# Patient Record
Sex: Male | Born: 1955 | Race: White | Hispanic: No | Marital: Married | Smoking: Current every day smoker
Health system: Southern US, Community
[De-identification: ages and names within clinical notes are randomized; demographics above are authoritative.]

## PROBLEM LIST (undated history)

## (undated) DIAGNOSIS — R351 Nocturia: Secondary | ICD-10-CM

## (undated) DIAGNOSIS — J410 Simple chronic bronchitis: Secondary | ICD-10-CM

## (undated) DIAGNOSIS — M549 Dorsalgia, unspecified: Secondary | ICD-10-CM

## (undated) DIAGNOSIS — R3915 Urgency of urination: Secondary | ICD-10-CM

## (undated) DIAGNOSIS — K219 Gastro-esophageal reflux disease without esophagitis: Secondary | ICD-10-CM

## (undated) DIAGNOSIS — R531 Weakness: Secondary | ICD-10-CM

## (undated) DIAGNOSIS — R519 Headache, unspecified: Secondary | ICD-10-CM

## (undated) DIAGNOSIS — R350 Frequency of micturition: Secondary | ICD-10-CM

## (undated) DIAGNOSIS — I10 Essential (primary) hypertension: Secondary | ICD-10-CM

## (undated) DIAGNOSIS — M1990 Unspecified osteoarthritis, unspecified site: Secondary | ICD-10-CM

## (undated) DIAGNOSIS — R51 Headache: Secondary | ICD-10-CM

## (undated) DIAGNOSIS — M2550 Pain in unspecified joint: Secondary | ICD-10-CM

## (undated) HISTORY — PX: LASIK: SHX215

---

## 1999-01-08 ENCOUNTER — Encounter: Payer: Self-pay | Admitting: Family Medicine

## 1999-01-08 ENCOUNTER — Ambulatory Visit (HOSPITAL_COMMUNITY): Admission: RE | Admit: 1999-01-08 | Discharge: 1999-01-08 | Payer: Self-pay | Admitting: Family Medicine

## 1999-01-20 ENCOUNTER — Encounter: Admission: RE | Admit: 1999-01-20 | Discharge: 1999-02-23 | Payer: Self-pay | Admitting: Family Medicine

## 2001-07-21 ENCOUNTER — Emergency Department (HOSPITAL_COMMUNITY): Admission: EM | Admit: 2001-07-21 | Discharge: 2001-07-21 | Payer: Self-pay | Admitting: Emergency Medicine

## 2003-07-24 ENCOUNTER — Ambulatory Visit (HOSPITAL_COMMUNITY): Admission: RE | Admit: 2003-07-24 | Discharge: 2003-07-24 | Payer: Self-pay | Admitting: Family Medicine

## 2010-02-14 HISTORY — PX: CERVICAL FUSION: SHX112

## 2010-10-26 ENCOUNTER — Other Ambulatory Visit: Payer: Self-pay

## 2010-10-26 ENCOUNTER — Other Ambulatory Visit: Payer: Self-pay | Admitting: Family Medicine

## 2010-10-26 ENCOUNTER — Other Ambulatory Visit: Payer: Self-pay | Admitting: Neurological Surgery

## 2010-10-26 DIAGNOSIS — IMO0002 Reserved for concepts with insufficient information to code with codable children: Secondary | ICD-10-CM

## 2010-10-26 DIAGNOSIS — M5412 Radiculopathy, cervical region: Secondary | ICD-10-CM

## 2010-11-09 ENCOUNTER — Ambulatory Visit
Admission: RE | Admit: 2010-11-09 | Discharge: 2010-11-09 | Disposition: A | Discharge status: Home / Self Care | Payer: 59 | Source: Ambulatory Visit | Attending: Family Medicine | Admitting: Family Medicine

## 2010-11-09 DIAGNOSIS — M5412 Radiculopathy, cervical region: Secondary | ICD-10-CM

## 2010-11-09 DIAGNOSIS — IMO0002 Reserved for concepts with insufficient information to code with codable children: Secondary | ICD-10-CM

## 2011-02-23 ENCOUNTER — Other Ambulatory Visit: Payer: Self-pay | Admitting: Neurosurgery

## 2011-02-23 ENCOUNTER — Ambulatory Visit
Admission: RE | Admit: 2011-02-23 | Discharge: 2011-02-23 | Disposition: A | Discharge status: Home / Self Care | Payer: 59 | Source: Ambulatory Visit | Attending: Neurosurgery | Admitting: Neurosurgery

## 2011-02-23 DIAGNOSIS — M542 Cervicalgia: Secondary | ICD-10-CM

## 2011-04-06 ENCOUNTER — Other Ambulatory Visit: Payer: Self-pay | Admitting: Neurosurgery

## 2011-04-06 DIAGNOSIS — M542 Cervicalgia: Secondary | ICD-10-CM

## 2011-04-13 ENCOUNTER — Ambulatory Visit
Admission: RE | Admit: 2011-04-13 | Discharge: 2011-04-13 | Disposition: A | Discharge status: Home / Self Care | Payer: 59 | Source: Ambulatory Visit | Attending: Neurosurgery | Admitting: Neurosurgery

## 2011-04-13 DIAGNOSIS — M542 Cervicalgia: Secondary | ICD-10-CM

## 2011-09-05 ENCOUNTER — Other Ambulatory Visit: Payer: Self-pay | Admitting: Neurosurgery

## 2011-09-05 DIAGNOSIS — M542 Cervicalgia: Secondary | ICD-10-CM

## 2011-09-16 ENCOUNTER — Other Ambulatory Visit: Payer: 59

## 2013-09-10 ENCOUNTER — Other Ambulatory Visit: Payer: Self-pay | Admitting: Neurosurgery

## 2013-09-10 DIAGNOSIS — M47812 Spondylosis without myelopathy or radiculopathy, cervical region: Secondary | ICD-10-CM

## 2013-09-27 ENCOUNTER — Ambulatory Visit
Admission: RE | Admit: 2013-09-27 | Discharge: 2013-09-27 | Disposition: A | Discharge status: Home / Self Care | Payer: No Typology Code available for payment source | Source: Ambulatory Visit | Attending: Neurosurgery | Admitting: Neurosurgery

## 2013-09-27 ENCOUNTER — Other Ambulatory Visit: Payer: Self-pay | Admitting: Neurosurgery

## 2013-09-27 DIAGNOSIS — M47812 Spondylosis without myelopathy or radiculopathy, cervical region: Secondary | ICD-10-CM

## 2013-09-27 MED ORDER — IOHEXOL 300 MG/ML  SOLN
1.0000 mL | Freq: Once | INTRAMUSCULAR | Status: AC | PRN
  Administered 2013-09-27: 1 mL via INTRA_ARTICULAR

## 2013-09-27 MED ORDER — DEXAMETHASONE SODIUM PHOSPHATE 4 MG/ML IJ SOLN
12.0000 mg | Freq: Once | INTRAMUSCULAR | Status: AC
  Administered 2013-09-27: 12 mg via INTRA_ARTICULAR

## 2013-09-27 NOTE — Discharge Instructions (Signed)

## 2014-04-15 ENCOUNTER — Other Ambulatory Visit: Payer: Self-pay | Admitting: Neurosurgery

## 2014-04-15 DIAGNOSIS — M47812 Spondylosis without myelopathy or radiculopathy, cervical region: Secondary | ICD-10-CM

## 2014-04-22 ENCOUNTER — Ambulatory Visit
Admission: RE | Admit: 2014-04-22 | Discharge: 2014-04-22 | Disposition: A | Discharge status: Home / Self Care | Payer: 59 | Source: Ambulatory Visit | Attending: Neurosurgery | Admitting: Neurosurgery

## 2014-04-22 VITALS — BP 178/82 | HR 95

## 2014-04-22 DIAGNOSIS — M4712 Other spondylosis with myelopathy, cervical region: Secondary | ICD-10-CM

## 2014-04-22 DIAGNOSIS — M47812 Spondylosis without myelopathy or radiculopathy, cervical region: Secondary | ICD-10-CM

## 2014-04-22 MED ORDER — ONDANSETRON HCL 4 MG/2ML IJ SOLN: 4.0000 mg | Freq: Four times a day (QID) | INTRAMUSCULAR | Status: DC | PRN

## 2014-04-22 MED ORDER — DIAZEPAM 5 MG PO TABS
10.0000 mg | ORAL_TABLET | Freq: Once | ORAL | Status: AC
  Administered 2014-04-22: 10 mg via ORAL

## 2014-04-22 MED ORDER — IOHEXOL 300 MG/ML  SOLN
10.0000 mL | Freq: Once | INTRAMUSCULAR | Status: AC | PRN
  Administered 2014-04-22: 10 mL via INTRATHECAL

## 2014-04-22 MED ORDER — HYDROMORPHONE HCL 1 MG/ML IJ SOLN
1.0000 mg | Freq: Once | INTRAMUSCULAR | Status: AC
  Administered 2014-04-22: 1 mg via INTRAMUSCULAR

## 2014-04-22 MED ORDER — ONDANSETRON HCL 4 MG/2ML IJ SOLN
4.0000 mg | Freq: Once | INTRAMUSCULAR | Status: AC
Start: 2014-04-22 — End: 2014-04-22
  Administered 2014-04-22: 4 mg via INTRAMUSCULAR

## 2014-04-22 NOTE — Discharge Instructions (Addendum)
Myelogram Discharge Instructions  1. Go home and rest quietly for the next 24 hours.  It is important to lie flat for the next 24 hours.  Get up only to go to the restroom.  You may lie in the bed or on a couch on your back, your stomach, your left side or your right side.  You may have one pillow under your head.  You may have pillows between your knees while you are on your side or under your knees while you are on your back.  2. DO NOT drive today.  Recline the seat as far back as it will go, while still wearing your seat belt, on the way home.  3. You may get up to go to the bathroom as needed.  You may sit up for 10 minutes to eat.  You may resume your normal diet and medications unless otherwise indicated.  Drink lots of extra fluids today and tomorrow.  4. The incidence of headache, nausea, or vomiting is about 5% (one in 20 patients).  If you develop a headache, lie flat and drink plenty of fluids until the headache goes away.  Caffeinated beverages may be helpful.  If you develop severe nausea and vomiting or a headache that does not go away with flat bed rest, call [phone].  5. You may resume normal activities after your 24 hours of bed rest is over; however, do not exert yourself strongly or do any heavy lifting tomorrow. If when you get up you have a headache when standing, go back to bed and force fluids for another 24 hours.  6. Call your physician for a follow-up appointment.  The results of your myelogram will be sent directly to your physician by the following day.  7. If you have any questions or if complications develop after you arrive home, please call [phone].  Discharge instructions have been explained to the patient.  The patient, or the person responsible for the patient, fully understands these instructions.      May resume Ultram on April 23, 2014, after 2 pm.

## 2014-04-22 NOTE — Progress Notes (Addendum)
Patient states he has been off Ultram/Tramadol for at least the past two days prior to cervical myelogram.  Donell SievertJeanne Haruo Stepanek, RN

## 2014-08-29 ENCOUNTER — Other Ambulatory Visit: Payer: Self-pay | Admitting: Neurosurgery

## 2014-09-03 ENCOUNTER — Encounter (HOSPITAL_COMMUNITY)
Admission: RE | Admit: 2014-09-03 | Discharge: 2014-09-03 | Disposition: A | Discharge status: Home / Self Care | Payer: 59 | Source: Ambulatory Visit | Attending: Neurosurgery | Admitting: Neurosurgery

## 2014-09-03 ENCOUNTER — Encounter (HOSPITAL_COMMUNITY): Payer: Self-pay

## 2014-09-03 DIAGNOSIS — Z87891 Personal history of nicotine dependence: Secondary | ICD-10-CM | POA: Diagnosis not present

## 2014-09-03 DIAGNOSIS — I10 Essential (primary) hypertension: Secondary | ICD-10-CM | POA: Insufficient documentation

## 2014-09-03 DIAGNOSIS — M479 Spondylosis, unspecified: Secondary | ICD-10-CM | POA: Insufficient documentation

## 2014-09-03 DIAGNOSIS — M549 Dorsalgia, unspecified: Secondary | ICD-10-CM | POA: Diagnosis not present

## 2014-09-03 DIAGNOSIS — Z01812 Encounter for preprocedural laboratory examination: Secondary | ICD-10-CM | POA: Insufficient documentation

## 2014-09-03 DIAGNOSIS — I4510 Unspecified right bundle-branch block: Secondary | ICD-10-CM | POA: Diagnosis not present

## 2014-09-03 DIAGNOSIS — Z981 Arthrodesis status: Secondary | ICD-10-CM | POA: Diagnosis not present

## 2014-09-03 DIAGNOSIS — Z01818 Encounter for other preprocedural examination: Secondary | ICD-10-CM | POA: Insufficient documentation

## 2014-09-03 DIAGNOSIS — K219 Gastro-esophageal reflux disease without esophagitis: Secondary | ICD-10-CM | POA: Diagnosis not present

## 2014-09-03 HISTORY — DX: Pain in unspecified joint: M25.50

## 2014-09-03 HISTORY — DX: Headache, unspecified: R51.9

## 2014-09-03 HISTORY — DX: Essential (primary) hypertension: I10

## 2014-09-03 HISTORY — DX: Urgency of urination: R39.15

## 2014-09-03 HISTORY — DX: Unspecified osteoarthritis, unspecified site: M19.90

## 2014-09-03 HISTORY — DX: Nocturia: R35.1

## 2014-09-03 HISTORY — DX: Dorsalgia, unspecified: M54.9

## 2014-09-03 HISTORY — DX: Weakness: R53.1

## 2014-09-03 HISTORY — DX: Frequency of micturition: R35.0

## 2014-09-03 HISTORY — DX: Simple chronic bronchitis: J41.0

## 2014-09-03 HISTORY — DX: Headache: R51

## 2014-09-03 HISTORY — DX: Gastro-esophageal reflux disease without esophagitis: K21.9

## 2014-09-03 LAB — SURGICAL PCR SCREEN
MRSA, PCR: NEGATIVE
Staphylococcus aureus: POSITIVE — AB

## 2014-09-03 NOTE — Progress Notes (Addendum)
Cardiologist denies having one  [facility]  Echo denies ever having one  Stress test denies ever having one  Heart cath denies ever having one  EKG denies having one in past yr  CXR denies in past yr  CBC/CMET done on the July 15 requested from Dr.Everyly

## 2014-09-03 NOTE — Progress Notes (Signed)
   09/03/14 1520  OBSTRUCTIVE SLEEP APNEA  Have you ever been diagnosed with sleep apnea through a sleep study? No  Do you snore loudly (loud enough to be heard through closed doors)?  1  Do you often feel tired, fatigued, or sleepy during the daytime? 0  Has anyone observed you stop breathing during your sleep? 1  Do you have, or are you being treated for high blood pressure? 1  BMI more than 35 kg/m2? 0  Age over 57 years old? 1  Neck circumference greater than 40 cm/16 inches? 1 (17.5)  Gender: 1

## 2014-09-03 NOTE — Progress Notes (Signed)
   09/03/14 1421  OBSTRUCTIVE SLEEP APNEA  Have you ever been diagnosed with sleep apnea through a sleep study? No  Do you snore loudly (loud enough to be heard through closed doors)?  1  Do you often feel tired, fatigued, or sleepy during the daytime? 0  Has anyone observed you stop breathing during your sleep? 1  Do you have, or are you being treated for high blood pressure? 1  BMI more than 35 kg/m2? 0  Age over 57 years old? 1  Neck circumference greater than 40 cm/16 inches? 1 (17.5)  Gender: 1

## 2014-09-03 NOTE — Progress Notes (Signed)
Mupirocin Ointment Rx called into [facility] for positive PCR of staph. Pt notified and voiced understanding.

## 2014-09-03 NOTE — Pre-Procedure Instructions (Signed)
Benedict Needyavid A Tinch Sr.  09/03/2014      [facility] [place] [address] [place] [id number] Phone: [phone] Fax: [phone]    Your procedure is scheduled on Tues, July 26 @ 12:10 PM  [facility] 10[facility]10 AM.  Call this number if you have problems the morning of surgery:  [phone]   Remember:  Do not eat food or drink liquids after midnight.  Take these medicines the morning of surgery with A SIP OF WATER Pain Pill(if needed)              No Goody's,BC's,Aleve,Aspirin,Ibuprofen,Fish Oil,or any Herbal Medications.    Do not wear jewelry, make-up or nail polish.  Do not wear lotions, powders, or perfumes.  You may wear deodorant.  Do not shave 48 hours prior to surgery.    Do not bring valuables to the hospital.  [facility] is not responsible for any belongings or valuables.  Contacts, dentures or bridgework may not be worn into surgery.  Leave your suitcase in the car.  After surgery it may be brought to your room.  For patients admitted to the hospital, discharge time will be determined by your treatment team.  Patients discharged the day of surgery will not be allowed to drive home.    Special instructions:  [place] - Preparing for Surgery  Before surgery, you can play an important role.  Because skin is not sterile, your skin needs to be as free of germs as possible.  You can reduce the number of germs on you skin by washing with CHG (chlorahexidine gluconate) soap before surgery.  CHG is an antiseptic cleaner which kills germs and bonds with the skin to continue killing germs even after washing.  Please DO NOT use if you have an allergy to CHG or antibacterial soaps.  If your skin becomes reddened/irritated stop using the CHG and inform your nurse when you arrive at Short Stay.  Do not shave (including legs and underarms) for at least 48 hours prior to the  first CHG shower.  You may shave your face.  Please follow these instructions carefully:   1.  Shower with CHG Soap the night before surgery and the                                morning of Surgery.  2.  If you choose to wash your hair, wash your hair first as usual with your       normal shampoo.  3.  After you shampoo, rinse your hair and body thoroughly to remove the                      Shampoo.  4.  Use CHG as you would any other liquid soap.  You can apply chg directly       to the skin and wash gently with scrungie or a clean washcloth.  5.  Apply the CHG Soap to your body ONLY FROM THE NECK DOWN.        Do not use on open wounds or open sores.  Avoid contact with your eyes,       ears, mouth and genitals (private parts).  Wash genitals (private parts)       with your normal soap.  6.  Wash thoroughly, paying  special attention to the area where your surgery        will be performed.  7.  Thoroughly rinse your body with warm water from the neck down.  8.  DO NOT shower/wash with your normal soap after using and rinsing off       the CHG Soap.  9.  Pat yourself dry with a clean towel.            10.  Wear clean pajamas.            11.  Place clean sheets on your bed the night of your first shower and do not        sleep with pets.  Day of Surgery  Do not apply any lotions/deoderants the morning of surgery.  Please wear clean clothes to the hospital/surgery center.    Please read over the following fact sheets that you were given. Pain Booklet, Coughing and Deep Breathing, MRSA Information and Surgical Site Infection Prevention

## 2014-09-04 NOTE — Progress Notes (Addendum)
Anesthesia Chart Review: Patient is a 59 year old male scheduled for C3-4, C4-5 ACDF on 09/09/14 by Dr. Jordan Likes.  History includes smoking, HTN, back pain, GERD, cervical fusion '12. OSA screening score was 6. BMI is consistent with obesity. PCP is Dr. Hope Pigeon.  Meds include Zestoretic, Percocet.  09/03/14 EKG: NSR, possible LAE, LAD, inferior infarct (age undetermined), incomplete RBBB, LVH with repolarization abnormality (laberal T wave abnormality, worse in high lateral leads). He denied having a prior stress, echo, or cardiac cath. There are no comparison EKGs in Epic, Muse, or at Dr. Florence Canner.   04/22/14 CT C-spine: IMPRESSION: ACDF C5-6 and C6-7. 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 Solid fusion C6-7. Disc degeneration and spondylosis right greater than left at C3-4 with right foraminal encroachment mild spinal stenosis. Disc degeneration and spondylosis at C4-5 causing mild spinal stenosis and mild foraminal narrowing bilaterally.  Labs from [facility] [place] from 08/29/14 received. CMET WNL, except glucose was 127. CBC WNL.    Reviewed history and EKG with anesthesiologist Dr. Michelle Piper.  Recommend preoperative cardiology evaluation due to abnormal EKG with history of smoking and HTN. Erie Noe at Dr. Lindalou Hose notified.  Velna Ochs [facility] Short Stay Center/Anesthesiology Phone [phone] 09/04/2014 4:33 PM  Addendum: Patient was referred to cardiologist Dr. Lady Gary with [facility] (see Care Everywhere). His note and recent stress test results are not yet in Care Everywhere, so I called and faxed a request to [facility] asking for those records prior to patient's surgery tomorrow morning. Currently, staff at [facility] say the images for his recent stress test are not up yet to send. In the interim, Dr. Lindalou Hose office did fax a signed clearance note from Dr. Lady Gary that is now on patient's chart. Nursing staff can follow up tomorrow if additional records not yet  received--hopefully, reports can be viewed in Care Everywhere by then.  Velna Ochs [facility] Short Stay Center/Anesthesiology Phone [phone] 09/08/2014 3:46 PM

## 2014-09-09 ENCOUNTER — Encounter (HOSPITAL_COMMUNITY)
Admission: RE | Disposition: A | Discharge status: Home / Self Care | Payer: Self-pay | Source: Ambulatory Visit | Attending: Neurosurgery

## 2014-09-09 ENCOUNTER — Encounter (HOSPITAL_COMMUNITY): Payer: Self-pay | Admitting: General Practice

## 2014-09-09 ENCOUNTER — Ambulatory Visit (HOSPITAL_COMMUNITY): Payer: 59 | Admitting: Certified Registered Nurse Anesthetist

## 2014-09-09 ENCOUNTER — Inpatient Hospital Stay (HOSPITAL_COMMUNITY)
Admission: RE | Admit: 2014-09-09 | Discharge: 2014-09-09 | DRG: 473 | Disposition: A | Discharge status: Home / Self Care | Payer: 59 | Source: Ambulatory Visit | Attending: Neurosurgery | Admitting: Neurosurgery

## 2014-09-09 ENCOUNTER — Ambulatory Visit (HOSPITAL_COMMUNITY): Payer: 59

## 2014-09-09 ENCOUNTER — Ambulatory Visit (HOSPITAL_COMMUNITY): Payer: 59 | Admitting: Vascular Surgery

## 2014-09-09 DIAGNOSIS — F17210 Nicotine dependence, cigarettes, uncomplicated: Secondary | ICD-10-CM | POA: Diagnosis present

## 2014-09-09 DIAGNOSIS — K219 Gastro-esophageal reflux disease without esophagitis: Secondary | ICD-10-CM | POA: Diagnosis present

## 2014-09-09 DIAGNOSIS — M542 Cervicalgia: Secondary | ICD-10-CM | POA: Diagnosis present

## 2014-09-09 DIAGNOSIS — M2578 Osteophyte, vertebrae: Secondary | ICD-10-CM | POA: Diagnosis present

## 2014-09-09 DIAGNOSIS — I10 Essential (primary) hypertension: Secondary | ICD-10-CM | POA: Diagnosis present

## 2014-09-09 DIAGNOSIS — M4722 Other spondylosis with radiculopathy, cervical region: Principal | ICD-10-CM | POA: Diagnosis present

## 2014-09-09 DIAGNOSIS — M4802 Spinal stenosis, cervical region: Secondary | ICD-10-CM | POA: Diagnosis present

## 2014-09-09 DIAGNOSIS — Z419 Encounter for procedure for purposes other than remedying health state, unspecified: Secondary | ICD-10-CM

## 2014-09-09 DIAGNOSIS — M47812 Spondylosis without myelopathy or radiculopathy, cervical region: Secondary | ICD-10-CM | POA: Diagnosis present

## 2014-09-09 HISTORY — PX: ANTERIOR CERVICAL DECOMP/DISCECTOMY FUSION: SHX1161

## 2014-09-09 SURGERY — ANTERIOR CERVICAL DECOMPRESSION/DISCECTOMY FUSION 2 LEVELS
Anesthesia: General

## 2014-09-09 MED ORDER — PROPOFOL 10 MG/ML IV BOLUS
INTRAVENOUS | Status: DC | PRN
  Administered 2014-09-09: 50 mg via INTRAVENOUS
  Administered 2014-09-09: 200 mg via INTRAVENOUS

## 2014-09-09 MED ORDER — PHENYLEPHRINE HCL 10 MG/ML IJ SOLN
INTRAMUSCULAR | Status: DC | PRN
  Administered 2014-09-09: 80 ug via INTRAVENOUS
  Administered 2014-09-09: 120 ug via INTRAVENOUS
  Administered 2014-09-09: 80 ug via INTRAVENOUS
  Administered 2014-09-09: 40 ug via INTRAVENOUS
  Administered 2014-09-09: 80 ug via INTRAVENOUS
  Administered 2014-09-09 (×2): 40 ug via INTRAVENOUS
  Administered 2014-09-09: 80 ug via INTRAVENOUS
  Administered 2014-09-09: 120 ug via INTRAVENOUS
  Administered 2014-09-09 (×2): 40 ug via INTRAVENOUS

## 2014-09-09 MED ORDER — HYDROMORPHONE HCL 1 MG/ML IJ SOLN
INTRAMUSCULAR | Status: AC
  Administered 2014-09-09: 0.5 mg via INTRAVENOUS
  Filled 2014-09-09: qty 1

## 2014-09-09 MED ORDER — CEFAZOLIN SODIUM-DEXTROSE 2-3 GM-% IV SOLR
2.0000 g | INTRAVENOUS | Status: AC
  Administered 2014-09-09: 2 g via INTRAVENOUS
  Filled 2014-09-09: qty 50

## 2014-09-09 MED ORDER — MEPERIDINE HCL 25 MG/ML IJ SOLN
INTRAMUSCULAR | Status: AC
  Administered 2014-09-09: 12.5 mg via INTRAVENOUS
  Filled 2014-09-09: qty 1

## 2014-09-09 MED ORDER — HYDROMORPHONE HCL 1 MG/ML IJ SOLN
0.2500 mg | INTRAMUSCULAR | Status: DC | PRN
  Administered 2014-09-09 (×4): 0.5 mg via INTRAVENOUS

## 2014-09-09 MED ORDER — CYCLOBENZAPRINE HCL 10 MG PO TABS
ORAL_TABLET | ORAL | Status: AC
Start: 2014-09-09 — End: 2014-09-09
  Administered 2014-09-09: 10 mg via ORAL
  Filled 2014-09-09: qty 1

## 2014-09-09 MED ORDER — SUCCINYLCHOLINE CHLORIDE 20 MG/ML IJ SOLN
INTRAMUSCULAR | Status: AC
  Filled 2014-09-09: qty 1

## 2014-09-09 MED ORDER — ONDANSETRON HCL 4 MG/2ML IJ SOLN
INTRAMUSCULAR | Status: DC | PRN
  Administered 2014-09-09: 4 mg via INTRAVENOUS

## 2014-09-09 MED ORDER — LACTATED RINGERS IV SOLN
INTRAVENOUS | Status: DC
  Administered 2014-09-09 (×3): via INTRAVENOUS

## 2014-09-09 MED ORDER — ARTIFICIAL TEARS OP OINT
TOPICAL_OINTMENT | OPHTHALMIC | Status: AC
  Filled 2014-09-09: qty 7

## 2014-09-09 MED ORDER — ONDANSETRON HCL 4 MG/2ML IJ SOLN
INTRAMUSCULAR | Status: AC
  Filled 2014-09-09: qty 2

## 2014-09-09 MED ORDER — ONDANSETRON HCL 4 MG/2ML IJ SOLN
4.0000 mg | Freq: Once | INTRAMUSCULAR | Status: AC | PRN
  Administered 2014-09-09: 4 mg via INTRAVENOUS

## 2014-09-09 MED ORDER — ACETAMINOPHEN 650 MG RE SUPP: 650.0000 mg | RECTAL | Status: DC | PRN

## 2014-09-09 MED ORDER — ONDANSETRON HCL 4 MG/2ML IJ SOLN: 4.0000 mg | INTRAMUSCULAR | Status: DC | PRN

## 2014-09-09 MED ORDER — PHENOL 1.4 % MT LIQD: 1.0000 | OROMUCOSAL | Status: DC | PRN

## 2014-09-09 MED ORDER — NEOSTIGMINE METHYLSULFATE 10 MG/10ML IV SOLN
INTRAVENOUS | Status: AC
  Filled 2014-09-09: qty 1

## 2014-09-09 MED ORDER — ACETAMINOPHEN 325 MG PO TABS: 650.0000 mg | ORAL_TABLET | ORAL | Status: DC | PRN

## 2014-09-09 MED ORDER — OXYCODONE-ACETAMINOPHEN 5-325 MG PO TABS
1.0000 | ORAL_TABLET | ORAL | Status: DC | PRN
Start: 2014-09-09 — End: 2014-09-09
  Administered 2014-09-09: 2 via ORAL
  Filled 2014-09-09: qty 2

## 2014-09-09 MED ORDER — PROPOFOL 10 MG/ML IV BOLUS
INTRAVENOUS | Status: AC
  Filled 2014-09-09: qty 20

## 2014-09-09 MED ORDER — SODIUM CHLORIDE 0.9 % IR SOLN
Status: DC | PRN
  Administered 2014-09-09: 12:00:00

## 2014-09-09 MED ORDER — LIDOCAINE HCL (CARDIAC) 20 MG/ML IV SOLN
INTRAVENOUS | Status: DC | PRN
  Administered 2014-09-09: 100 mg via INTRAVENOUS
  Administered 2014-09-09: 100 mg via INTRATRACHEAL

## 2014-09-09 MED ORDER — LIDOCAINE HCL (CARDIAC) 20 MG/ML IV SOLN
INTRAVENOUS | Status: AC
  Filled 2014-09-09: qty 20

## 2014-09-09 MED ORDER — LISINOPRIL-HYDROCHLOROTHIAZIDE 20-12.5 MG PO TABS: 1.0000 | ORAL_TABLET | Freq: Every day | ORAL | Status: DC

## 2014-09-09 MED ORDER — VECURONIUM BROMIDE 10 MG IV SOLR
INTRAVENOUS | Status: AC
  Filled 2014-09-09: qty 10

## 2014-09-09 MED ORDER — DEXAMETHASONE SODIUM PHOSPHATE 10 MG/ML IJ SOLN
10.0000 mg | INTRAMUSCULAR | Status: AC
  Administered 2014-09-09: 10 mg via INTRAVENOUS
  Filled 2014-09-09: qty 1

## 2014-09-09 MED ORDER — ROCURONIUM BROMIDE 50 MG/5ML IV SOLN
INTRAVENOUS | Status: AC
  Filled 2014-09-09: qty 2

## 2014-09-09 MED ORDER — EPHEDRINE SULFATE 50 MG/ML IJ SOLN
INTRAMUSCULAR | Status: DC | PRN
  Administered 2014-09-09: 10 mg via INTRAVENOUS

## 2014-09-09 MED ORDER — SODIUM CHLORIDE 0.9 % IJ SOLN: 3.0000 mL | Freq: Two times a day (BID) | INTRAMUSCULAR | Status: DC

## 2014-09-09 MED ORDER — STERILE WATER FOR INJECTION IJ SOLN
INTRAMUSCULAR | Status: AC
  Filled 2014-09-09: qty 30

## 2014-09-09 MED ORDER — MIDAZOLAM HCL 5 MG/5ML IJ SOLN
INTRAMUSCULAR | Status: DC | PRN
  Administered 2014-09-09: 2 mg via INTRAVENOUS

## 2014-09-09 MED ORDER — 0.9 % SODIUM CHLORIDE (POUR BTL) OPTIME
TOPICAL | Status: DC | PRN
  Administered 2014-09-09: 1000 mL

## 2014-09-09 MED ORDER — FENTANYL CITRATE (PF) 250 MCG/5ML IJ SOLN
INTRAMUSCULAR | Status: AC
Start: 2014-09-09 — End: 2014-09-09
  Filled 2014-09-09: qty 5

## 2014-09-09 MED ORDER — MIDAZOLAM HCL 2 MG/2ML IJ SOLN
INTRAMUSCULAR | Status: AC
  Filled 2014-09-09: qty 2

## 2014-09-09 MED ORDER — FENTANYL CITRATE (PF) 250 MCG/5ML IJ SOLN
INTRAMUSCULAR | Status: AC
  Filled 2014-09-09: qty 5

## 2014-09-09 MED ORDER — MENTHOL 3 MG MT LOZG: 1.0000 | LOZENGE | OROMUCOSAL | Status: DC | PRN

## 2014-09-09 MED ORDER — SODIUM CHLORIDE 0.9 % IJ SOLN: 3.0000 mL | INTRAMUSCULAR | Status: DC | PRN

## 2014-09-09 MED ORDER — OXYCODONE-ACETAMINOPHEN 10-325 MG PO TABS: 1.0000 | ORAL_TABLET | ORAL | Status: AC | PRN

## 2014-09-09 MED ORDER — HYDROCODONE-ACETAMINOPHEN 5-325 MG PO TABS: 1.0000 | ORAL_TABLET | ORAL | Status: DC | PRN

## 2014-09-09 MED ORDER — LISINOPRIL 20 MG PO TABS
20.0000 mg | ORAL_TABLET | Freq: Every day | ORAL | Status: DC
  Administered 2014-09-09: 20 mg via ORAL
  Filled 2014-09-09: qty 1

## 2014-09-09 MED ORDER — THROMBIN 5000 UNITS EX SOLR
OROMUCOSAL | Status: DC | PRN
  Administered 2014-09-09: 14:00:00 via TOPICAL

## 2014-09-09 MED ORDER — ONDANSETRON HCL 4 MG/2ML IJ SOLN
INTRAMUSCULAR | Status: AC
  Filled 2014-09-09: qty 4

## 2014-09-09 MED ORDER — HYDROMORPHONE HCL 1 MG/ML IJ SOLN
0.5000 mg | INTRAMUSCULAR | Status: DC | PRN
  Administered 2014-09-09: 1 mg via INTRAVENOUS
  Filled 2014-09-09: qty 1

## 2014-09-09 MED ORDER — ROCURONIUM BROMIDE 100 MG/10ML IV SOLN
INTRAVENOUS | Status: DC | PRN
  Administered 2014-09-09: 50 mg via INTRAVENOUS

## 2014-09-09 MED ORDER — HEMOSTATIC AGENTS (NO CHARGE) OPTIME
TOPICAL | Status: DC | PRN
  Administered 2014-09-09: 1 via TOPICAL

## 2014-09-09 MED ORDER — FENTANYL CITRATE (PF) 100 MCG/2ML IJ SOLN
INTRAMUSCULAR | Status: DC | PRN
  Administered 2014-09-09 (×2): 50 ug via INTRAVENOUS
  Administered 2014-09-09: 150 ug via INTRAVENOUS
  Administered 2014-09-09 (×2): 100 ug via INTRAVENOUS
  Administered 2014-09-09: 50 ug via INTRAVENOUS

## 2014-09-09 MED ORDER — HYDROCHLOROTHIAZIDE 12.5 MG PO CAPS
12.5000 mg | ORAL_CAPSULE | Freq: Every day | ORAL | Status: DC
  Administered 2014-09-09: 12.5 mg via ORAL
  Filled 2014-09-09: qty 1

## 2014-09-09 MED ORDER — CYCLOBENZAPRINE HCL 10 MG PO TABS
10.0000 mg | ORAL_TABLET | Freq: Three times a day (TID) | ORAL | Status: DC | PRN
  Administered 2014-09-09: 10 mg via ORAL

## 2014-09-09 MED ORDER — CEFAZOLIN SODIUM 1-5 GM-% IV SOLN
1.0000 g | Freq: Three times a day (TID) | INTRAVENOUS | Status: DC
  Filled 2014-09-09 (×2): qty 50

## 2014-09-09 MED ORDER — SODIUM CHLORIDE 0.9 % IV SOLN: 250.0000 mL | INTRAVENOUS | Status: DC

## 2014-09-09 MED ORDER — MEPERIDINE HCL 25 MG/ML IJ SOLN
6.2500 mg | INTRAMUSCULAR | Status: DC | PRN
  Administered 2014-09-09: 12.5 mg via INTRAVENOUS

## 2014-09-09 MED ORDER — THROMBIN 5000 UNITS EX SOLR
CUTANEOUS | Status: DC | PRN
  Administered 2014-09-09 (×2): 5000 [IU] via TOPICAL

## 2014-09-09 SURGICAL SUPPLY — 59 items
BAG DECANTER FOR FLEXI CONT (MISCELLANEOUS) ×3 IMPLANT
BENZOIN TINCTURE PRP APPL 2/3 (GAUZE/BANDAGES/DRESSINGS) ×3 IMPLANT
BIT DRILL 13 (BIT) ×2 IMPLANT
BIT DRILL 13MM (BIT) ×1
BRUSH SCRUB EZ PLAIN DRY (MISCELLANEOUS) ×3 IMPLANT
BUR MATCHSTICK NEURO 3.0 LAGG (BURR) ×3 IMPLANT
CAGE PEEK 6X14X11 (Cage) ×2 IMPLANT
CAGE SPNL 11X14X6XRADOPQ (Cage) ×1 IMPLANT
CANISTER SUCT 3000ML PPV (MISCELLANEOUS) ×3 IMPLANT
CLOSURE WOUND 1/2 X4 (GAUZE/BANDAGES/DRESSINGS) ×1
CONT SPEC 4OZ CLIKSEAL STRL BL (MISCELLANEOUS) ×3 IMPLANT
DRAPE C-ARM 42X72 X-RAY (DRAPES) ×6 IMPLANT
DRAPE LAPAROTOMY 100X72 PEDS (DRAPES) ×3 IMPLANT
DRAPE MICROSCOPE LEICA (MISCELLANEOUS) ×3 IMPLANT
DRAPE POUCH INSTRU U-SHP 10X18 (DRAPES) ×3 IMPLANT
DURAPREP 6ML APPLICATOR 50/CS (WOUND CARE) ×3 IMPLANT
ELECT COATED BLADE 2.86 ST (ELECTRODE) ×3 IMPLANT
ELECT REM PT RETURN 9FT ADLT (ELECTROSURGICAL) ×3
ELECTRODE REM PT RTRN 9FT ADLT (ELECTROSURGICAL) ×1 IMPLANT
GAUZE SPONGE 4X4 12PLY STRL (GAUZE/BANDAGES/DRESSINGS) ×3 IMPLANT
GAUZE SPONGE 4X4 16PLY XRAY LF (GAUZE/BANDAGES/DRESSINGS) IMPLANT
GLOVE ECLIPSE 7.0 STRL STRAW (GLOVE) ×3 IMPLANT
GLOVE ECLIPSE 9.0 STRL (GLOVE) ×3 IMPLANT
GLOVE EXAM NITRILE LRG STRL (GLOVE) IMPLANT
GLOVE EXAM NITRILE MD LF STRL (GLOVE) IMPLANT
GLOVE EXAM NITRILE XL STR (GLOVE) IMPLANT
GLOVE EXAM NITRILE XS STR PU (GLOVE) IMPLANT
GLOVE INDICATOR 7.5 STRL GRN (GLOVE) ×6 IMPLANT
GLOVE SURG SS PI 7.0 STRL IVOR (GLOVE) ×9 IMPLANT
GOWN STRL REUS W/ TWL LRG LVL3 (GOWN DISPOSABLE) IMPLANT
GOWN STRL REUS W/ TWL XL LVL3 (GOWN DISPOSABLE) ×1 IMPLANT
GOWN STRL REUS W/TWL 2XL LVL3 (GOWN DISPOSABLE) IMPLANT
GOWN STRL REUS W/TWL LRG LVL3 (GOWN DISPOSABLE)
GOWN STRL REUS W/TWL XL LVL3 (GOWN DISPOSABLE) ×2
HALTER HD/CHIN CERV TRACTION D (MISCELLANEOUS) ×3 IMPLANT
HEMOSTAT POWDER SURGIFOAM 1G (HEMOSTASIS) ×3 IMPLANT
HEMOSTAT SURGICEL 2X14 (HEMOSTASIS) IMPLANT
KIT BASIN OR (CUSTOM PROCEDURE TRAY) ×3 IMPLANT
KIT ROOM TURNOVER OR (KITS) ×3 IMPLANT
NEEDLE SPNL 20GX3.5 QUINCKE YW (NEEDLE) ×3 IMPLANT
NS IRRIG 1000ML POUR BTL (IV SOLUTION) ×3 IMPLANT
PACK LAMINECTOMY NEURO (CUSTOM PROCEDURE TRAY) ×3 IMPLANT
PAD ARMBOARD 7.5X6 YLW CONV (MISCELLANEOUS) ×9 IMPLANT
PEEK CAGE 7X14X11 (Cage) ×3 IMPLANT
PLATE 3 60XNS SPNE CVD ANT T (Plate) ×1 IMPLANT
PLATE 3 ATLANTIS TRANS (Plate) ×2 IMPLANT
RUBBERBAND STERILE (MISCELLANEOUS) ×6 IMPLANT
SCREW ST FIX 4 ATL 3120213 (Screw) ×24 IMPLANT
SPONGE INTESTINAL PEANUT (DISPOSABLE) ×3 IMPLANT
SPONGE SURGIFOAM ABS GEL SZ50 (HEMOSTASIS) ×3 IMPLANT
STRIP CLOSURE SKIN 1/2X4 (GAUZE/BANDAGES/DRESSINGS) ×2 IMPLANT
SUT VIC AB 4-0 RB1 18 (SUTURE) ×3 IMPLANT
SYR 20ML ECCENTRIC (SYRINGE) ×3 IMPLANT
TAPE CLOTH 4X10 WHT NS (GAUZE/BANDAGES/DRESSINGS) ×3 IMPLANT
TAPE CLOTH SURG 4X10 WHT LF (GAUZE/BANDAGES/DRESSINGS) ×3 IMPLANT
TOWEL OR 17X24 6PK STRL BLUE (TOWEL DISPOSABLE) ×3 IMPLANT
TOWEL OR 17X26 10 PK STRL BLUE (TOWEL DISPOSABLE) ×3 IMPLANT
TRAP SPECIMEN MUCOUS 40CC (MISCELLANEOUS) ×3 IMPLANT
WATER STERILE IRR 1000ML POUR (IV SOLUTION) ×3 IMPLANT

## 2014-09-09 NOTE — Anesthesia Postprocedure Evaluation (Signed)
Anesthesia Post Note  Patient: Bob Needy Sr.  Procedure(s) Performed: Procedure(s) (LRB): ANTERIOR CERVICAL DECOMPRESSION/DISCECTOMY FUSION CERVICAL THREE-FOUR,CERVICAL FOUR-FIVE WITH REEXPLORATION AND REVISION OF CERVICAQL FIVE-SIX FUSION (N/A)  Anesthesia type: general  Patient location: PACU  Post pain: Pain level controlled  Post assessment: Patient's Cardiovascular Status Stable  Last Vitals:  Filed Vitals:   09/09/14 1614  BP: 165/92  Pulse: 104  Temp:   Resp: 16    Post vital signs: Reviewed and stable  Level of consciousness: sedated  Complications: No apparent anesthesia complications

## 2014-09-09 NOTE — Op Note (Signed)
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   Revision of C5-6 anterior cervical fusion with interbody morselized locally harvested autograft  C3-4, C4-5 anterior cervical discectomy with interbody fusion utilizing interbody peek cage, locally harvested autograft,  Anterior plate instrumentation from C3-C6  Surgeon:Shakeerah Gradel A.Makayleigh Poliquin, M.D.  Asst. Surgeon: Conchita Paris  Anesthesia: General  Indication: 59 year old male with chronic neck and left upper extremity/shoulder pain. Workup demonstrates evidence of severe progressive spondylosis with marked left-sided C4-5 neural foraminal stenosis. Plan for C3-4 and C4-5 anterior cervical discectomies and fusion. Patient status post previous C5-C7 anterior cervical decompression and fusion. Fusion is definitely solid at C6-7. Although some images seem to suggest solid fusion at C5-6 others suggest possible pseudoarthrosis. There is no obvious motion with bending x-rays. Plan is to explore this area and determine the status of the fusion.  Operative note: After induction anesthesia, patient position supine with neck slightly extended and held in place of halter traction. Anterior cervical region prepped and draped sterilely. Incision made overlying the C5-6 level. This carried down sharply to the platysma. Platysma was then divided vertically and dissection proceeds along the medial border of the sternocleidomastoid muscle and carotid sheath. Trachea and esophagus were mobilized and retracted towards the left. Prevertebral fascia was stripped off the anterior skull:. Longus colli muscles elevated bilaterally using R Carter. Deep  self-retaining first placed intraoperative fluoroscopy is used levels were confirmed. Previously placed anterior cervical plate from Z6-X0 was dissected free and disassembled and removed. Anterior osteophytes at C4-5 and C3-4 were resected. Bone was saved for use and later autografting. Disc spaces at C3-4 and C4-5 and then incised 15 blade in a rectangular fashion. Wide disc space cleanout was then achieved using pituitary rongeurs, forward and backward angle Carlen curettes. Kerrison rongeurs and the high-speed drill. All elements of the disc were removed down to level posterior annulus. Microscope was then brought to the field use at the remainder of the discectomy. Remaining aspects of annulus and osteophytes were removed using high-speed drill down to the level of the posterior longitudinal ligament. Posterior ligament ligament was then elevated and resected in piecemeal fashion. Underlying thecal sac was then identified. A wide central decompression was then performed by undercutting the bodies of C4 and C5. Decompression then proceeded into each neural foramen. Wide anterior foraminotomies were performed on the course exiting C5 nerve roots bilaterally. At this point a very thorough decompression had been achieved. There is no evidence of injury to the thecal sac or nerve roots. Procedure then repeated at C3-4 again without complications. Interbody fusions were then performed using interbody Medtronic anatomic peek cages packed with locally harvested morcellized autograft. Each cage was recessed slightly from the anterior cortical margin. The previous anterior cervical fusion at C5-6 was explored. Although I cannot demonstrate gross motion there was a thin line without obvious bridging bone. I drilled out the separation laterally on both sides. I packed these areas with autograft. I felt the previous allograft wedge that was in place seemed to be structurally solid and I didn't see any value in drilling that  out as well. Medtronic anterior Atlantis translational plate was then placed over the C3, C4, C5, C6 levels. This is an attachment or fluoroscopic guidance using 13 mm fixed angle screws all 8  screws final tightening found be solidly within the bone. Locking screws and gauge at all 4 levels. Final images revealed good position the bone graft and hardware at proper upper level with normal alignment is fine. Wound is then irrigated one final time. Hemostasis was assured with bipolar cautery. Wounds and closed in a typical fashion. Steri-Strips sterile dressing were applied. No apparent complications. Patient tolerated the procedure well and he returns to the recovery room posterior

## 2014-09-09 NOTE — Transfer of Care (Signed)
Immediate Anesthesia Transfer of Care Note  Patient: Bob Ibarra  Procedure(s) Performed: Procedure(s): ANTERIOR CERVICAL DECOMPRESSION/DISCECTOMY FUSION CERVICAL THREE-FOUR,CERVICAL FOUR-FIVE WITH REEXPLORATION AND REVISION OF CERVICAQL FIVE-SIX FUSION (N/A)  Patient Location: PACU  Anesthesia Type:General  Level of Consciousness: awake, alert , oriented and patient cooperative  Airway & Oxygen Therapy: Patient Spontanous Breathing and Patient connected to face mask oxygen  Post-op Assessment: Report given to RN, Post -op Vital signs reviewed and stable, Patient moving all extremities and Patient moving all extremities X 4  Post vital signs: Reviewed and stable  Last Vitals:  Filed Vitals:   09/09/14 1037  BP: 187/91  Pulse:   Temp:   Resp:     Complications: No apparent anesthesia complications

## 2014-09-09 NOTE — H&P (Signed)
  Bob Needy Sr. is an 59 y.o. male.   Chief Complaint: Neck pain HPI: 59 year old male with neck and bilateral shoulder and arm pain failing conservative management. Patient status post previous C5-C7 anterior cervical discectomies and fusion with good results. Patient with progressive spondylosis and stenosis at C3-4 and C4-5. Patient presents now for C3-4 and C4-5 anterior cervical discectomy and fusion.  Past Medical History  Diagnosis Date  . Hypertension     takes Lisinopril-HCTZ daily  . Smokers' cough   . Headache     r/t neck problems  . Weakness     numbness and tingling  . Arthritis     neck,knees,elbows,and wrist  . Joint pain   . Back pain     reason unknown   . GERD (gastroesophageal reflux disease)     takes Tums if needed  . Urinary frequency   . Nocturia   . Urinary urgency     Past Surgical History  Procedure Laterality Date  . Cervical fusion  2012  . Lasik      History reviewed. No pertinent family history. Social History:  reports that he has been smoking.  He does not have any smokeless tobacco history on file. He reports that he does not drink alcohol or use illicit drugs.  Allergies:  Allergies  Allergen Reactions  . Morphine And Related Nausea And Vomiting    Medications Prior to Admission  Medication Sig Dispense Refill  . lisinopril-hydrochlorothiazide (PRINZIDE,ZESTORETIC) 20-12.5 MG per tablet Take 1 tablet by mouth daily.    Marland Kitchen oxyCODONE-acetaminophen (PERCOCET/ROXICET) 5-325 MG per tablet Take by mouth every 4 (four) hours as needed for severe pain.      No results found for this or any previous visit (from the past 48 hour(s)). No results found.    Blood pressure 187/91, pulse 109, temperature 97.5 F (36.4 C), temperature source Oral, resp. rate 20, height  (1.803 m), weight 104.826 kg (231 lb 1.6 oz), SpO2 97 %.  The patient is awake and alert. He is oriented and appropriate. Cranial nerve function is intact. Motor and  sensory examination extremities are intact. Wound is clean and dry. Chest and abdomen are benign. Extremities are free from injury or deformity. Examination head ears eyes are there is unremarkable. Examination cervical spine reveals well-healed surgical incision. Airways midline. Pulses are normal bilaterally. No obvious bony abnormality. Moderate cervical tenderness and spasm. 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 Plan C3-4, C4-5 anterior cervical discectomies and interbody fusion utilizing interbody peek cages, locally harvested autograft, and anterior plate instrumentation. This will require removal of his previous anterior plate from W0-J8. Risks and benefits of been explained. Patient wishes to proceed.  Aiyanah Kalama A 09/09/2014, 11:21 AM

## 2014-09-09 NOTE — Anesthesia Procedure Notes (Signed)
Procedure Name: Intubation Date/Time: 09/09/2014 11:56 AM Performed by: Shirlyn Goltz Pre-anesthesia Checklist: Patient identified, Emergency Drugs available, Suction available and Patient being monitored Patient Re-evaluated:Patient Re-evaluated prior to inductionOxygen Delivery Method: Circle system utilized Preoxygenation: Pre-oxygenation with 100% oxygen Intubation Type: IV induction Ventilation: Oral airway inserted - appropriate to patient size and Two handed mask ventilation required Laryngoscope Size: Mac and 4 Grade View: Grade II Tube type: Oral Tube size: 7.0 mm Number of attempts: 1 Airway Equipment and Method: Stylet and LTA kit utilized Placement Confirmation: ETT inserted through vocal cords under direct vision,  positive ETCO2 and breath sounds checked- equal and bilateral Secured at: 22 cm Tube secured with: Tape Dental Injury: Teeth and Oropharynx as per pre-operative assessment

## 2014-09-09 NOTE — Progress Notes (Signed)
Utilization review completed.  

## 2014-09-09 NOTE — Anesthesia Preprocedure Evaluation (Addendum)
Anesthesia Evaluation  Patient identified by MRN, date of birth, ID band Patient awake    Reviewed: Allergy & Precautions, NPO status , Patient's Chart, lab work & pertinent test results  Airway Mallampati: I  TM Distance: >3 FB Neck ROM: Full    Dental  (+) Dental Advisory Given, Poor Dentition   Pulmonary Current Smoker,    Pulmonary exam normal       Cardiovascular hypertension, Pt. on medications Normal cardiovascular exam    Neuro/Psych    GI/Hepatic GERD-  Medicated and Controlled,  Endo/Other    Renal/GU      Musculoskeletal   Abdominal   Peds  Hematology   Anesthesia Other Findings   Reproductive/Obstetrics                            Anesthesia Physical Anesthesia Plan  ASA: II  Anesthesia Plan: General   Post-op Pain Management:    Induction: Intravenous  Airway Management Planned: Oral ETT  Additional Equipment:   Intra-op Plan:   Post-operative Plan: Extubation in OR  Informed Consent: I have reviewed the patients History and Physical, chart, labs and discussed the procedure including the risks, benefits and alternatives for the proposed anesthesia with the patient or authorized representative who has indicated his/her understanding and acceptance.     Plan Discussed with: CRNA and Surgeon  Anesthesia Plan Comments:         Anesthesia Quick Evaluation

## 2014-09-09 NOTE — Discharge Summary (Signed)
Physician Discharge Summary  Patient ID: Bob Needy Sr. MRN: [id number] DOB/AGE: 1955-07-25 59 y.o.  Admit date: 09/09/2014 Discharge date: 09/09/2014  Admission Diagnoses:  Discharge Diagnoses:  Principal Problem:   Cervical spondylosis without myelopathy Active Problems:   Cervical spondylosis with radiculopathy   Discharged Condition: good  Hospital Course: Patient admitted to the hospital where he underwent a two-level anterior cervical decompression and fusion. Postoperatively he is doing well. Neck and upper extremity pain much better. Ready for discharge home.  Consults:   Significant Diagnostic Studies:   Treatments:   Discharge Exam: Blood pressure 165/92, pulse 104, temperature 98.1 F (36.7 C), temperature source Oral, resp. rate 16, height  (1.803 m), weight 104.826 kg (231 lb 1.6 oz), SpO2 97 %. Awake and alert. Oriented and appropriate. Cranial nerve function intact. Motor and sensory function extremities normal. Wound clean and dry. Chest and abdomen benign.  Disposition: Final discharge disposition not confirmed     Medication List    STOP taking these medications        oxyCODONE-acetaminophen 5-325 MG per tablet  Commonly known as:  PERCOCET/ROXICET  Replaced by:  oxyCODONE-acetaminophen 10-325 MG per tablet      TAKE these medications        lisinopril-hydrochlorothiazide 20-12.5 MG per tablet  Commonly known as:  PRINZIDE,ZESTORETIC  Take 1 tablet by mouth daily.     oxyCODONE-acetaminophen 10-325 MG per tablet  Commonly known as:  PERCOCET  Take 1 tablet by mouth every 4 (four) hours as needed for pain.           Follow-up Information    Follow up with Temple Pacini, MD.   Specialty:  Neurosurgery   Contact information:   [address] [place] [address] [phone]       Signed: Temple Pacini 09/09/2014, 6:56 PM

## 2014-09-09 NOTE — Brief Op Note (Signed)
09/09/2014  2:12 PM  PATIENT:  Benedict Needy Sr.  59 y.o. male  PRE-OPERATIVE DIAGNOSIS:  Spondylosis  POST-OPERATIVE DIAGNOSIS:  Spondylosis  PROCEDURE:  Procedure(s): ANTERIOR CERVICAL DECOMPRESSION/DISCECTOMY FUSION CERVICAL THREE-FOUR,CERVICAL FOUR-FIVE WITH REEXPLORATION AND REVISION OF CERVICAQL FIVE-SIX FUSION (N/A)  SURGEON:  Surgeon(s) and Role:    * Julio Sicks, MD - Primary    * Lisbeth Renshaw, MD - Assisting  PHYSICIAN ASSISTANT:   ASSISTANTS:    ANESTHESIA:   general  EBL:  Total I/O In: 1000 [I.V.:1000] Out: 200 [Blood:200]  BLOOD ADMINISTERED:none  DRAINS: none   LOCAL MEDICATIONS USED:  LIDOCAINE   SPECIMEN:  No Specimen  DISPOSITION OF SPECIMEN:  N/A  COUNTS:  YES  TOURNIQUET:  * No tourniquets in log *  DICTATION: .Dragon Dictation  PLAN OF CARE: Admit to inpatient   PATIENT DISPOSITION:  PACU - hemodynamically stable.   Delay start of Pharmacological VTE agent (>24hrs) due to surgical blood loss or risk of bleeding: yes

## 2014-09-09 NOTE — Progress Notes (Signed)
Pt doing well. Pt and wife given D/C instructions with Rx, verbal understanding was provided. Pt's IV was removed prior to D/C. Pt's incision is clean and dry with no evidence of edema. Pt D/C'd home via walking @ 1925 per MD order. Pt is stable @ D/C and has no other needs at this time. Rema Fendt, RN

## 2014-09-10 ENCOUNTER — Encounter (HOSPITAL_COMMUNITY): Payer: Self-pay | Admitting: Neurosurgery

## 2015-04-22 ENCOUNTER — Emergency Department (HOSPITAL_COMMUNITY)
Admission: EM | Admit: 2015-04-22 | Discharge: 2015-04-22 | Disposition: A | Discharge status: Home / Self Care | Payer: Medicare HMO | Attending: Emergency Medicine | Admitting: Emergency Medicine

## 2015-04-22 ENCOUNTER — Encounter (HOSPITAL_COMMUNITY): Payer: Self-pay | Admitting: Vascular Surgery

## 2015-04-22 DIAGNOSIS — M1990 Unspecified osteoarthritis, unspecified site: Secondary | ICD-10-CM | POA: Diagnosis not present

## 2015-04-22 DIAGNOSIS — I10 Essential (primary) hypertension: Secondary | ICD-10-CM | POA: Diagnosis not present

## 2015-04-22 DIAGNOSIS — F17200 Nicotine dependence, unspecified, uncomplicated: Secondary | ICD-10-CM | POA: Insufficient documentation

## 2015-04-22 DIAGNOSIS — Z79899 Other long term (current) drug therapy: Secondary | ICD-10-CM | POA: Insufficient documentation

## 2015-04-22 DIAGNOSIS — K219 Gastro-esophageal reflux disease without esophagitis: Secondary | ICD-10-CM | POA: Diagnosis not present

## 2015-04-22 MED ORDER — LISINOPRIL 20 MG PO TABS
20.0000 mg | ORAL_TABLET | Freq: Once | ORAL | Status: AC
  Administered 2015-04-22: 20 mg via ORAL
  Filled 2015-04-22: qty 1

## 2015-04-22 MED ORDER — HYDROCHLOROTHIAZIDE 25 MG PO TABS
25.0000 mg | ORAL_TABLET | Freq: Every day | ORAL | Status: DC
  Administered 2015-04-22: 25 mg via ORAL
  Filled 2015-04-22: qty 1

## 2015-04-22 MED ORDER — LISINOPRIL-HYDROCHLOROTHIAZIDE 20-12.5 MG PO TABS: 1.0000 | ORAL_TABLET | Freq: Every day | ORAL | Status: AC

## 2015-04-22 NOTE — ED Provider Notes (Signed)
CSN: [id number]     Arrival date & time 04/22/15  1508 History  By signing my name below, I, Emmanuella Mensah, attest that this documentation has been prepared under the direction and in the presence of Danelle BerryLeisa Jahziel Sinn, PA-C. Electronically Signed: Angelene GiovanniEmmanuella Mensah, ED Scribe. 04/22/2015. 5:10 PM.    Chief Complaint  Patient presents with  . Hypertension   The history is provided by the patient. No language interpreter was used.   HPI Comments: Bob HyDavid A Sulton Sr. is a 60 y.o. male with a hx of HTN, GERD, currently being treated for cervical spinal fusion and fractures by neurosurgery, Today he was at the surgeon's office when his blood pressure was noted to be elevated, systolic greater than 200, he called his PCP and both physicians instructed him to present to the ER for evaluation.  In the ER he complains of anxiety and neck pain and denies any other symptoms, including headache, chest pain, shortness of breath, abdominal pain, weakness, confusion, visual disturbances.  He states that he is currently on 5 mg Percocet for his chronic pain and his last dose was 2 hours ago. He states he is maintained on lisinopril and hydrochlorothiazide but has not been compliant with medications for at least the past 4 months. He endorses anxiety when going to hospitals or doctors, stating he has "white coat syndrome" and he currently is sweating from anxiety.  He also states he has a lot of stress at home.   Pt has an upcoming appointment with his PCP on 05/15/15. He denies a hx of HLD or DM. He reports a family hx of stroke and MI. He denies any HA, CP, chest pressure, SOB, vision changes, numbness, or tingling.    Past Medical History  Diagnosis Date  . Hypertension     takes Lisinopril-HCTZ daily  . Smokers' cough (HCC)   . Headache     r/t neck problems  . Weakness     numbness and tingling  . Arthritis     neck,knees,elbows,and wrist  . Joint pain   . Back pain     reason unknown   . GERD  (gastroesophageal reflux disease)     takes Tums if needed  . Urinary frequency   . Nocturia   . Urinary urgency    Past Surgical History  Procedure Laterality Date  . Cervical fusion  2012  . Lasik    . Anterior cervical decomp/discectomy fusion N/A 09/09/2014    Procedure: ANTERIOR CERVICAL DECOMPRESSION/DISCECTOMY FUSION CERVICAL THREE-FOUR,CERVICAL FOUR-FIVE WITH REEXPLORATION AND REVISION OF CERVICAQL FIVE-SIX FUSION;  Surgeon: Julio SicksHenry Pool, MD;  Location: [facility];  Service: Neurosurgery;  Laterality: N/A;   No family history on file. Social History  Substance Use Topics  . Smoking status: Current Every Day Smoker -- 1.00 packs/day for 40 years  . Smokeless tobacco: None  . Alcohol Use: No    Review of Systems  Constitutional: Negative.  Negative for fever, chills, diaphoresis, activity change, appetite change and fatigue.  HENT: Negative.   Eyes: Negative.  Negative for visual disturbance.  Respiratory: Negative.  Negative for chest tightness and shortness of breath.   Cardiovascular: Negative for chest pain.  Endocrine: Negative.   Genitourinary: Negative.   Neurological: Negative for numbness and headaches.  All other systems reviewed and are negative.     Allergies  Morphine and related  Home Medications   Prior to Admission medications   Medication Sig Start Date End Date Taking? Authorizing Provider  lisinopril-hydrochlorothiazide (PRINZIDE,ZESTORETIC) 20-12.5  MG tablet Take 1 tablet by mouth daily. 04/22/15   Danelle Berry, PA-C  oxyCODONE-acetaminophen (PERCOCET) 10-325 MG per tablet Take 1 tablet by mouth every 4 (four) hours as needed for pain. 09/09/14   Julio Sicks, MD   BP 193/91 mmHg  Pulse 71  Temp(Src) 97.7 F (36.5 C) (Oral)  Resp 18  SpO2 98% Physical Exam  Constitutional: He is oriented to person, place, and time. He appears well-developed and well-nourished. He is cooperative.  Non-toxic appearance. He does not have a sickly appearance. No  distress.  Well appearing male, NAD  HENT:  Head: Normocephalic and atraumatic.  Nose: Nose normal.  Mouth/Throat: Oropharynx is clear and moist. No oropharyngeal exudate.  Eyes: Conjunctivae and EOM are normal. Pupils are equal, round, and reactive to light. Right eye exhibits no discharge. Left eye exhibits no discharge. No scleral icterus.  Neck: Normal range of motion and phonation normal. Neck supple. No JVD present. No tracheal deviation present. No thyromegaly present.  Cardiovascular: Normal rate, regular rhythm, normal heart sounds, intact distal pulses and normal pulses.  PMI is not displaced.  Exam reveals no gallop and no friction rub.   No murmur heard. Pulses:      Carotid pulses are 2+ on the right side, and 2+ on the left side.      Dorsalis pedis pulses are 2+ on the right side, and 2+ on the left side.       Posterior tibial pulses are 2+ on the right side, and 2+ on the left side.  Pulmonary/Chest: Effort normal and breath sounds normal. No respiratory distress. He has no wheezes. He has no rales. He exhibits no tenderness.  Abdominal: Soft. Bowel sounds are normal. He exhibits no distension and no mass. There is no tenderness. There is no rebound and no guarding.  Musculoskeletal: Normal range of motion. He exhibits no edema or tenderness.  Lymphadenopathy:    He has no cervical adenopathy.  Neurological: He is alert and oriented to person, place, and time. He has normal reflexes. No cranial nerve deficit. He exhibits normal muscle tone. Coordination normal.  Speech is clear and goal oriented, follows commands Major Cranial nerves without deficit, no facial droop Normal strength in upper and lower extremities bilaterally including dorsiflexion and plantar flexion, strong and equal grip strength Sensation normal to light and sharp touch Moves extremities without ataxia, coordination intact Normal finger to nose and rapid alternating movements Neg romberg, no pronator  drift Normal gait and balance   Skin: Skin is warm and dry. No rash noted. He is not diaphoretic. No erythema. No pallor.  Psychiatric: He has a normal mood and affect. His behavior is normal. Judgment and thought content normal.  Nursing note and vitals reviewed.   ED Course  Procedures (including critical care time) DIAGNOSTIC STUDIES: Oxygen Saturation is 98% on RA, normal by my interpretation.    COORDINATION OF CARE: 5:08 PM- Pt advised of plan for treatment and pt agrees. Advised to have BP re checked in one week and pt can keep BP log at different times throughout the day when relaxed.    MDM   Pt is a 60 y/o male, sent to the ER by his neurosurgeon and PCP for evaluation of elevated BP, SBP ~200.  Upon arrival blood pressure was 208/107, patient is well-appearing, without any complaints of chest pain, shortness of breath, headaches, numbness, tingling, weakness, visual disturbances, rash.  He endorsed pain with recent neck surgery and cervical fractures, pain and tx  managed by neurosurgery, he also endorses anxiety with hospitals and doctors offices and many stressors at home.  He also admits to noncompliance with BP medications for several months, noncompliance to any modifications diet.   Patient's cardiovascular, pulm and neuro exam are normal.  Pt was given a dose of his home medications, was encouraged to follow up very closely with his PCP within the week.  He is in agreement with discharging home and following up with PCP.  DASH diet, lifestyle modifications, med compliance to antihypertensive meds and pain management encouraged.  Return precautions reviewed.    Final diagnoses:  Essential hypertension   I personally performed the services described in this documentation, which was scribed in my presence. The recorded information has been reviewed and is accurate.     Danelle Berry, PA-C 04/24/15 1741  Pricilla Loveless, MD 04/29/15 [phone]

## 2015-04-22 NOTE — Discharge Instructions (Signed)
DASH Eating Plan °DASH stands for "Dietary Approaches to Stop Hypertension." The DASH eating plan is a healthy eating plan that has been shown to reduce high blood pressure (hypertension). Additional health benefits may include reducing the risk of type 2 diabetes mellitus, heart disease, and stroke. The DASH eating plan may also help with weight loss. °WHAT DO I NEED TO KNOW ABOUT THE DASH EATING PLAN? °For the DASH eating plan, you will follow these general guidelines: °· Choose foods with a percent daily value for sodium of less than 5% (as listed on the food label). °· Use salt-free seasonings or herbs instead of table salt or sea salt. °· Check with your health care provider or pharmacist before using salt substitutes. °· Eat lower-sodium products, often labeled as "lower sodium" or "no salt added." °· Eat fresh foods. °· Eat more vegetables, fruits, and low-fat dairy products. °· Choose whole grains. Look for the word "whole" as the first word in the ingredient list. °· Choose fish and skinless chicken or turkey more often than red meat. Limit fish, poultry, and meat to 6 oz (170 g) each day. °· Limit sweets, desserts, sugars, and sugary drinks. °· Choose heart-healthy fats. °· Limit cheese to 1 oz (28 g) per day. °· Eat more home-cooked food and less restaurant, buffet, and fast food. °· Limit fried foods. °· Cook foods using methods other than frying. °· Limit canned vegetables. If you do use them, rinse them well to decrease the sodium. °· When eating at a restaurant, ask that your food be prepared with less salt, or no salt if possible. °WHAT FOODS CAN I EAT? °Seek help from a dietitian for individual calorie needs. °Grains °Whole grain or whole wheat bread. Brown rice. Whole grain or whole wheat pasta. Quinoa, bulgur, and whole grain cereals. Low-sodium cereals. Corn or whole wheat flour tortillas. Whole grain cornbread. Whole grain crackers. Low-sodium crackers. °Vegetables °Fresh or frozen vegetables  (raw, steamed, roasted, or grilled). Low-sodium or reduced-sodium tomato and vegetable juices. Low-sodium or reduced-sodium tomato sauce and paste. Low-sodium or reduced-sodium canned vegetables.  °Fruits °All fresh, canned (in natural juice), or frozen fruits. °Meat and Other Protein Products °Ground beef (85% or leaner), grass-fed beef, or beef trimmed of fat. Skinless chicken or turkey. Ground chicken or turkey. Pork trimmed of fat. All fish and seafood. Eggs. Dried beans, peas, or lentils. Unsalted nuts and seeds. Unsalted canned beans. °Dairy °Low-fat dairy products, such as skim or 1% milk, 2% or reduced-fat cheeses, low-fat ricotta or cottage cheese, or plain low-fat yogurt. Low-sodium or reduced-sodium cheeses. °Fats and Oils °Tub margarines without trans fats. Light or reduced-fat mayonnaise and salad dressings (reduced sodium). Avocado. Safflower, olive, or canola oils. Natural peanut or almond butter. °Other °Unsalted popcorn and pretzels. °The items listed above may not be a complete list of recommended foods or beverages. Contact your dietitian for more options. °WHAT FOODS ARE NOT RECOMMENDED? °Grains °White bread. White pasta. White rice. Refined cornbread. Bagels and croissants. Crackers that contain trans fat. °Vegetables °Creamed or fried vegetables. Vegetables in a cheese sauce. Regular canned vegetables. Regular canned tomato sauce and paste. Regular tomato and vegetable juices. °Fruits °Dried fruits. Canned fruit in light or heavy syrup. Fruit juice. °Meat and Other Protein Products °Fatty cuts of meat. Ribs, chicken wings, bacon, sausage, bologna, salami, chitterlings, fatback, hot dogs, bratwurst, and packaged luncheon meats. Salted nuts and seeds. Canned beans with salt. °Dairy °Whole or 2% milk, cream, half-and-half, and cream cheese. Whole-fat or sweetened yogurt. Full-fat   cheeses or blue cheese. Nondairy creamers and whipped toppings. Processed cheese, cheese spreads, or cheese  curds. °Condiments °Onion and garlic salt, seasoned salt, table salt, and sea salt. Canned and packaged gravies. Worcestershire sauce. Tartar sauce. Barbecue sauce. Teriyaki sauce. Soy sauce, including reduced sodium. Steak sauce. Fish sauce. Oyster sauce. Cocktail sauce. Horseradish. Ketchup and mustard. Meat flavorings and tenderizers. Bouillon cubes. Hot sauce. Tabasco sauce. Marinades. Taco seasonings. Relishes. °Fats and Oils °Butter, stick margarine, lard, shortening, ghee, and bacon fat. Coconut, palm kernel, or palm oils. Regular salad dressings. °Other °Pickles and olives. Salted popcorn and pretzels. °The items listed above may not be a complete list of foods and beverages to avoid. Contact your dietitian for more information. °WHERE CAN I FIND MORE INFORMATION? °[employer], [employer]: www.nhlbi.nih.gov/health/health-topics/topics/dash/ °  °This information is not intended to replace advice given to you by your health care provider. Make sure you discuss any questions you have with your health care provider. °  °Document Released: 01/20/2011 Document Revised: 02/21/2014 Document Reviewed: 12/05/2012 °Elsevier Interactive Patient Education ©2016 [employer] ° °How to Take Your Blood Pressure °HOW DO I GET A BLOOD PRESSURE MACHINE? °· You can buy an electronic home blood pressure machine at your local pharmacy. Insurance will sometimes cover the cost if you have a prescription. °· Ask your doctor what type of machine is best for you. There are different machines for your arm and your wrist. °· If you decide to buy a machine to check your blood pressure on your arm, first check the size of your arm so you can buy the right size cuff. To check the size of your arm:   °· Use a measuring tape that shows both inches and centimeters.   °· Wrap the measuring tape around the upper-middle part of your arm. You may need someone to help you measure.   °· Write down your arm measurement in both  inches and centimeters.   °· To measure your blood pressure correctly, it is important to have the right size cuff.   °· If your arm is up to 13 inches (up to 34 centimeters), get an adult cuff size. °· If your arm is 13 to 17 inches (35 to 44 centimeters), get a large adult cuff size.   °·  If your arm is 17 to 20 inches (45 to 52 centimeters), get an adult thigh cuff.   °WHAT DO THE NUMBERS MEAN?  °· There are two numbers that make up your blood pressure. For example: 120/80. °· The first number (120 in our example) is called the "systolic pressure." It is a measure of the pressure in your blood vessels when your heart is pumping blood. °· The second number (80 in our example) is called the "diastolic pressure." It is a measure of the pressure in your blood vessels when your heart is resting between beats. °· Your doctor will tell you what your blood pressure should be. °WHAT SHOULD I DO BEFORE I CHECK MY BLOOD PRESSURE?  °· Try to rest or relax for at least 30 minutes before you check your blood pressure. °· Do not smoke. °· Do not have any drinks with caffeine, such as: °· Soda. °· Coffee. °· Tea. °· Check your blood pressure in a quiet room. °· Sit down and stretch out your arm on a table. Keep your arm at about the level of your heart. Let your arm relax. °· Make sure that your legs are not crossed. °HOW DO I CHECK MY BLOOD PRESSURE? °· Follow   the directions that came with your machine. °· Make sure you remove any tight-fitting clothing from your arm or wrist. Wrap the cuff around your upper arm or wrist. You should be able to fit a finger between the cuff and your arm. If you cannot fit a finger between the cuff and your arm, it is too tight and should be removed and rewrapped. °· Some units require you to manually pump up the arm cuff. °· Automatic units inflate the cuff when you press a button. °· Cuff deflation is automatic in both models. °· After the cuff is inflated, the unit measures your blood  pressure and pulse. The readings are shown on a monitor. Hold still and breathe normally while the cuff is inflated. °· Getting a reading takes less than a minute. °· Some models store readings in a memory. Some provide a printout of readings. If your machine does not store your readings, keep a written record. °· Take readings with you to your next visit with your doctor. °  °This information is not intended to replace advice given to you by your health care provider. Make sure you discuss any questions you have with your health care provider. °  °Document Released: 01/14/2008 Document Revised: 02/21/2014 Document Reviewed: 03/28/2013 °Elsevier Interactive Patient Education ©2016 [employer] ° °Hypertension °Hypertension, commonly called high blood pressure, is when the force of blood pumping through your arteries is too strong. Your arteries are the blood vessels that carry blood from your heart throughout your body. A blood pressure reading consists of a higher number over a lower number, such as 110/72. The higher number (systolic) is the pressure inside your arteries when your heart pumps. The lower number (diastolic) is the pressure inside your arteries when your heart relaxes. Ideally you want your blood pressure below 120/80. °Hypertension forces your heart to work harder to pump blood. Your arteries may become narrow or stiff. Having untreated or uncontrolled hypertension can cause heart attack, stroke, kidney disease, and other problems. °RISK FACTORS °Some risk factors for high blood pressure are controllable. Others are not.  °Risk factors you cannot control include:  °· Race. You may be at higher risk if you are African American. °· Age. Risk increases with age. °· Gender. Men are at higher risk than women before age 45 years. After age 65, women are at higher risk than men. °Risk factors you can control include: °· Not getting enough exercise or physical activity. °· Being overweight. °· Getting too  much fat, sugar, calories, or salt in your diet. °· Drinking too much alcohol. °SIGNS AND SYMPTOMS °Hypertension does not usually cause signs or symptoms. Extremely high blood pressure (hypertensive crisis) may cause headache, anxiety, shortness of breath, and nosebleed. °DIAGNOSIS °To check if you have hypertension, your health care provider will measure your blood pressure while you are seated, with your arm held at the level of your heart. It should be measured at least twice using the same arm. Certain conditions can cause a difference in blood pressure between your right and left arms. A blood pressure reading that is higher than normal on one occasion does not mean that you need treatment. If it is not clear whether you have high blood pressure, you may be asked to return on a different day to have your blood pressure checked again. Or, you may be asked to monitor your blood pressure at home for 1 or more weeks. °TREATMENT °Treating high blood pressure includes making lifestyle changes and possibly   taking medicine. Living a healthy lifestyle can help lower high blood pressure. You may need to change some of your habits. °Lifestyle changes may include: °· Following the DASH diet. This diet is high in fruits, vegetables, and whole grains. It is low in salt, red meat, and added sugars. °· Keep your sodium intake below 2,300 mg per day. °· Getting at least 30-45 minutes of aerobic exercise at least 4 times per week. °· Losing weight if necessary. °· Not smoking. °· Limiting alcoholic beverages. °· Learning ways to reduce stress. °Your health care provider may prescribe medicine if lifestyle changes are not enough to get your blood pressure under control, and if one of the following is true: °· You are 18-59 years of age and your systolic blood pressure is above 140. °· You are 60 years of age or older, and your systolic blood pressure is above 150. °· Your diastolic blood pressure is above 90. °· You have  diabetes, and your systolic blood pressure is over 140 or your diastolic blood pressure is over 90. °· You have kidney disease and your blood pressure is above 140/90. °· You have heart disease and your blood pressure is above 140/90. °Your personal target blood pressure may vary depending on your medical conditions, your age, and other factors. °HOME CARE INSTRUCTIONS °· Have your blood pressure rechecked as directed by your health care provider.   °· Take medicines only as directed by your health care provider. Follow the directions carefully. Blood pressure medicines must be taken as prescribed. The medicine does not work as well when you skip doses. Skipping doses also puts you at risk for problems. °· Do not smoke.   °· Monitor your blood pressure at home as directed by your health care provider.  °SEEK MEDICAL CARE IF:  °· You think you are having a reaction to medicines taken. °· You have recurrent headaches or feel dizzy. °· You have swelling in your ankles. °· You have trouble with your vision. °SEEK IMMEDIATE MEDICAL CARE IF: °· You develop a severe headache or confusion. °· You have unusual weakness, numbness, or feel faint. °· You have severe chest or abdominal pain. °· You vomit repeatedly. °· You have trouble breathing. °MAKE SURE YOU:  °· Understand these instructions. °· Will watch your condition. °· Will get help right away if you are not doing well or get worse. °  °This information is not intended to replace advice given to you by your health care provider. Make sure you discuss any questions you have with your health care provider. °  °Document Released: 01/31/2005 Document Revised: 06/17/2014 Document Reviewed: 11/23/2012 °Elsevier Interactive Patient Education ©2016 [employer] Inc. ° °

## 2015-04-22 NOTE — ED Notes (Signed)
Pt ambulates independently and with steady gait at time of discharge. Discharge instructions and follow up information reviewed with patient. No other questions or concerns voiced at this time. RX x 1. 

## 2015-04-22 NOTE — ED Notes (Signed)
Pt reports to the ED for eval of HTN. Pt was at the neurosurgeon's office and his BP was noted to be in the 200s systolic. Pt has not been taking his medication because he stated it is not working. Pt denies any HA, CP, or blurred vision. Pt A&Ox4, resp e/u, and skin warm and dry.

## 2015-05-20 ENCOUNTER — Other Ambulatory Visit: Payer: Self-pay | Admitting: Neurosurgery

## 2015-05-20 DIAGNOSIS — M4722 Other spondylosis with radiculopathy, cervical region: Secondary | ICD-10-CM

## 2015-05-29 ENCOUNTER — Ambulatory Visit
Admission: RE | Admit: 2015-05-29 | Discharge: 2015-05-29 | Disposition: A | Discharge status: Home / Self Care | Payer: Medicare HMO | Source: Ambulatory Visit | Attending: Neurosurgery | Admitting: Neurosurgery

## 2015-05-29 DIAGNOSIS — M4722 Other spondylosis with radiculopathy, cervical region: Secondary | ICD-10-CM

## 2016-08-02 IMAGING — CT CT CERVICAL SPINE W/O CM
5 series · 16 of 33 positions shown, 18 images · non-contrast
Comparison: Radiograph dated 11/04/2014, 10/22/2014 and 09/11/2014
and CT scan 04/22/2014 and MRI dated 08/28/2013

CLINICAL DATA: Chronic neck pain. Bilateral arm pain with bilateral
intermittent finger numbness. Left arm weakness. Prior anterior
fusion at C3 through T1.

EXAM:
CT CERVICAL SPINE WITHOUT CONTRAST
TECHNIQUE: Multidetector CT imaging of the cervical spine was performed without
intravenous contrast. Multiplanar CT image reconstructions were also
generated.

[Series 3: c spine bone · axial · 0.23mm/px · z∈[+116,+221]mm · 3 of 86 slices shown]
[im 22/86  bone]
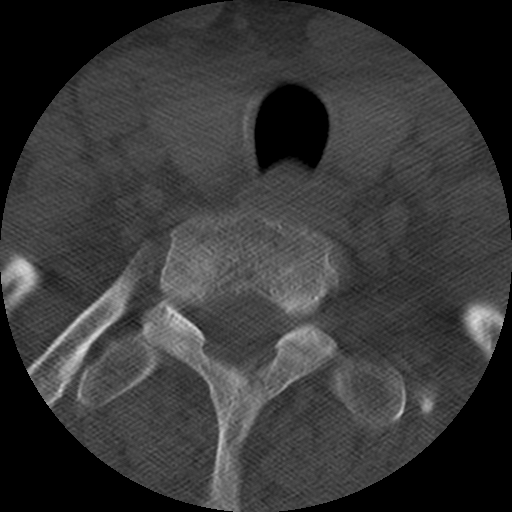
[im 43/86  bone]
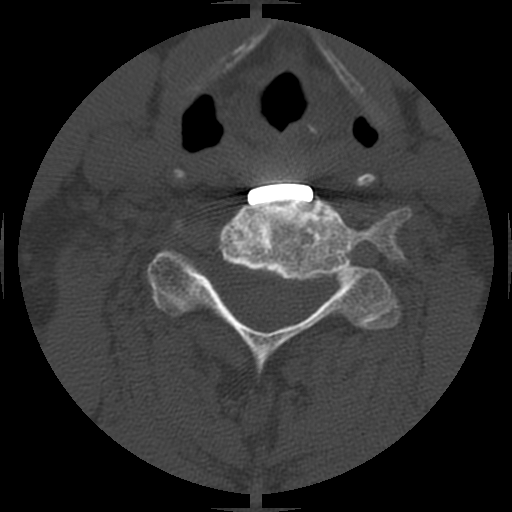
[im 64/86  bone]
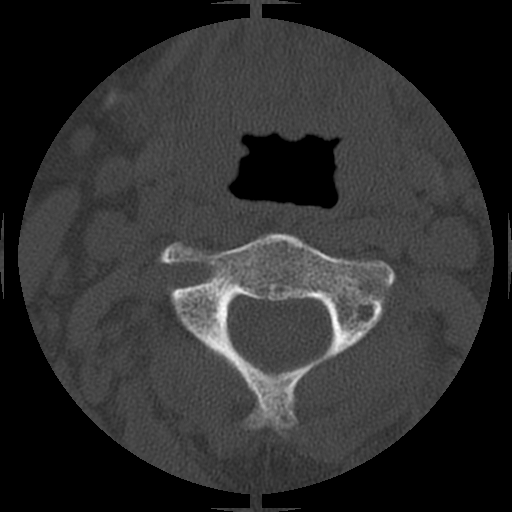

[Series 4: c spine soft · axial · 0.23mm/px · z∈[+134,+204]mm · 2 of 86 slices shown]
[im 29/86  soft-tissue]
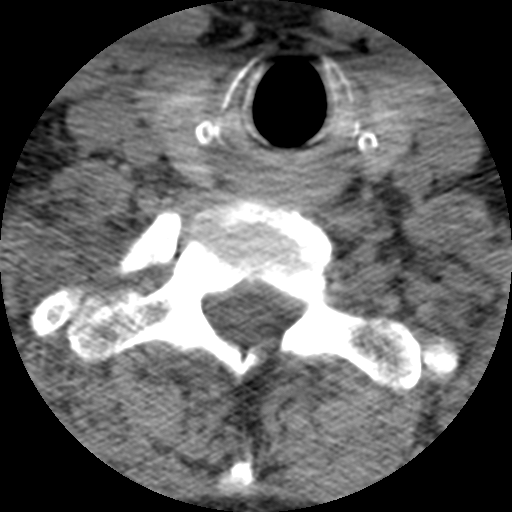
[im 57/86  soft-tissue]
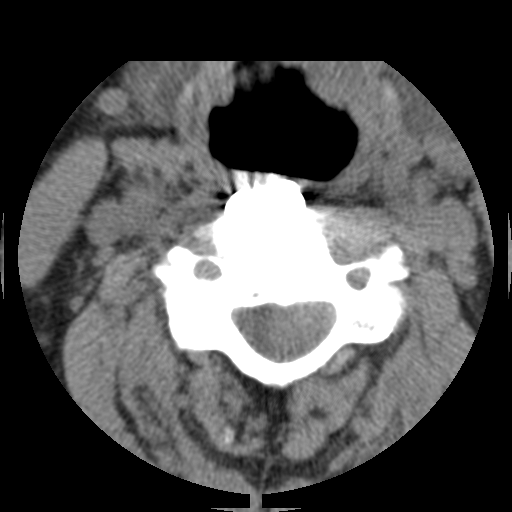

[Series 200: coronal · coronal · 0.43mm/px · 3 of 41 slices shown]
[im 9/41  bone]
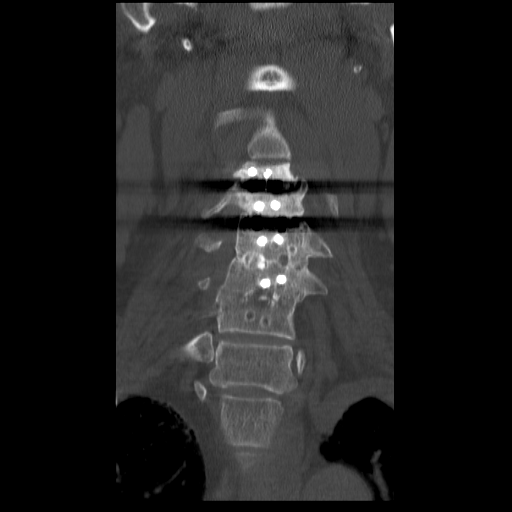
[im 17/41  bone]
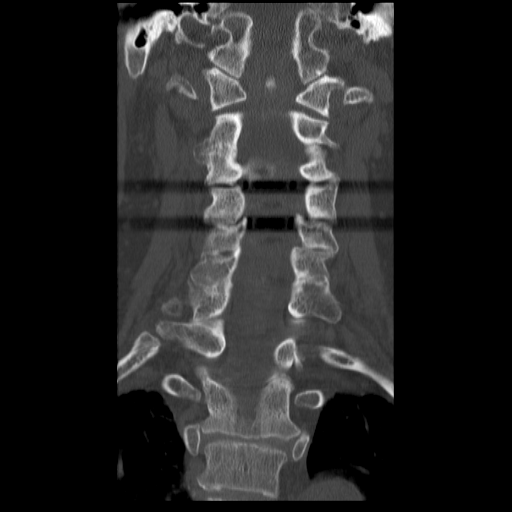
[im 25/41  bone]
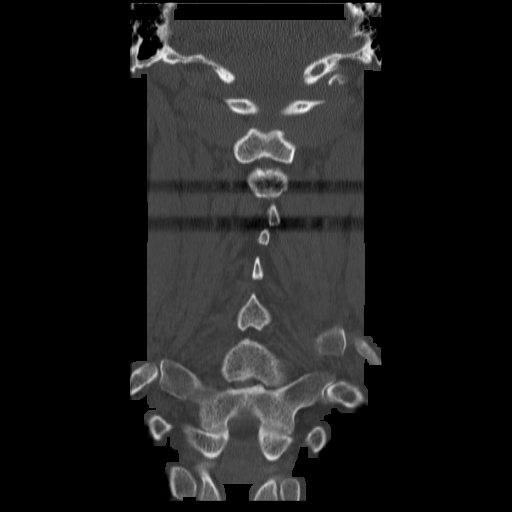

[Series 201: sagittal · sagittal · 0.43mm/px · 5 of 48 slices shown, 6 images]
[im 16/48  bone]
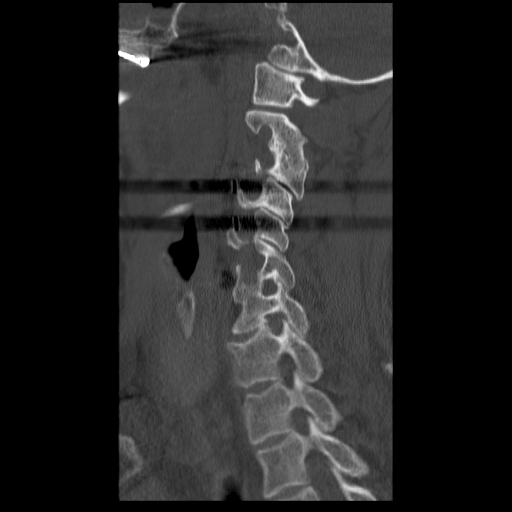
[im 20/48  bone]
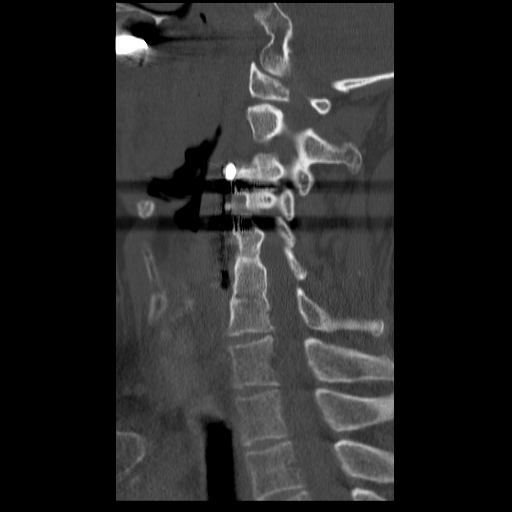
[im 24/48  soft-tissue]
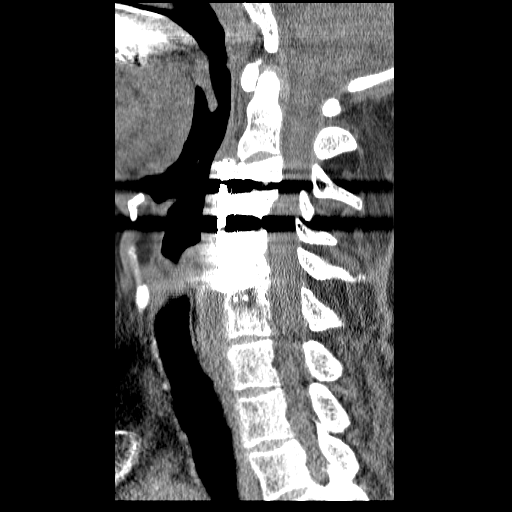
[im 24/48  bone]
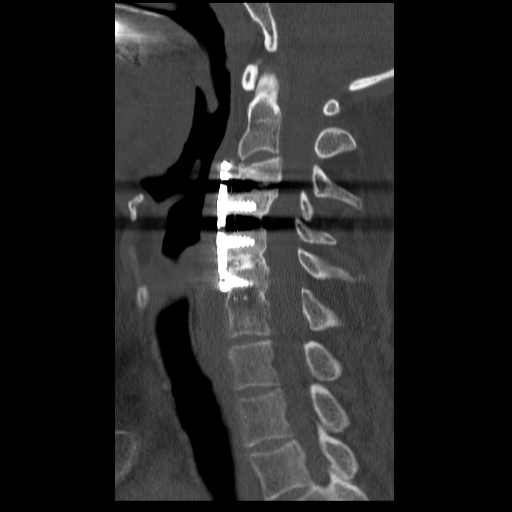
[im 28/48  bone]
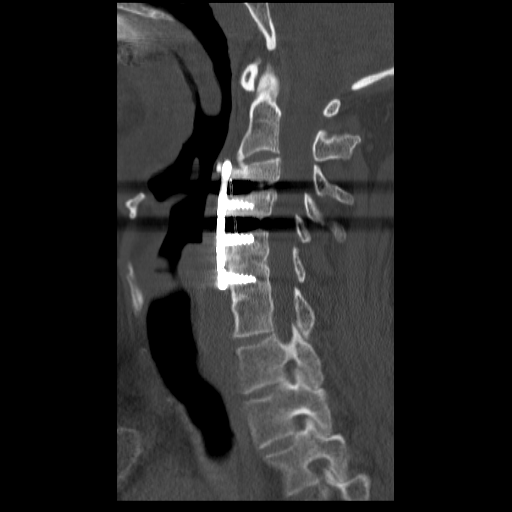
[im 32/48  bone]
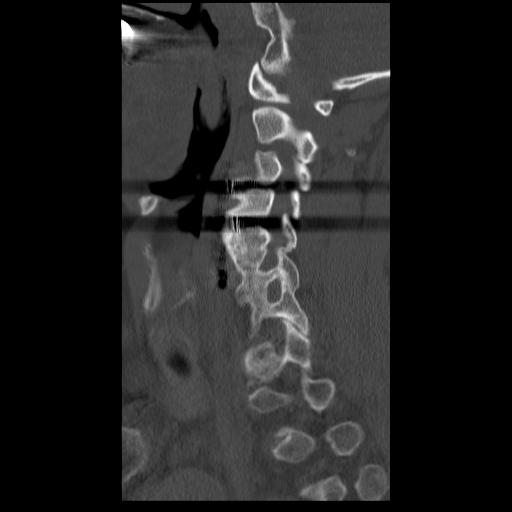

[Series 202: angled axial · axial · 0.27mm/px · z∈[+108,+215]mm · 3 of 109 slices shown, 4 images]
[im 28/109  soft-tissue]
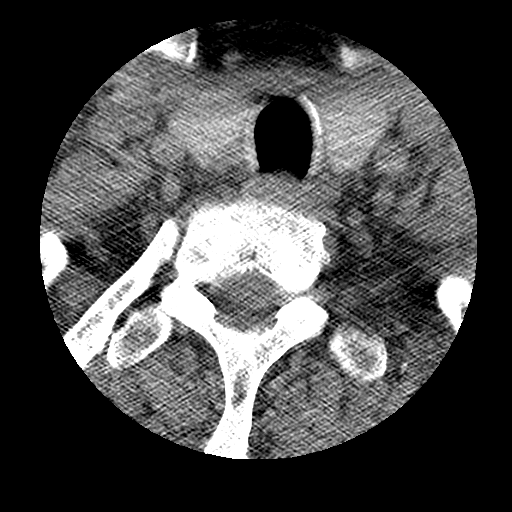
[im 28/109  bone]
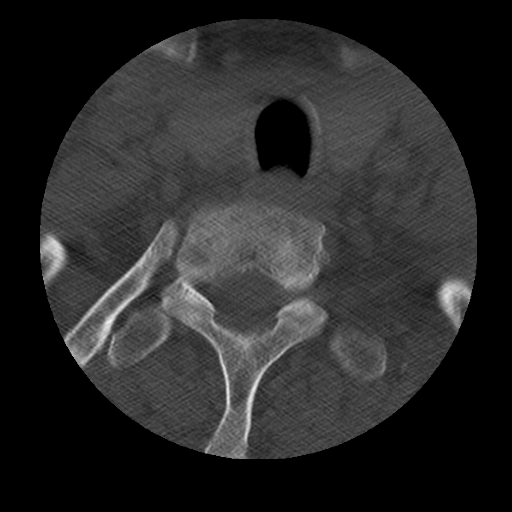
[im 55/109  bone]
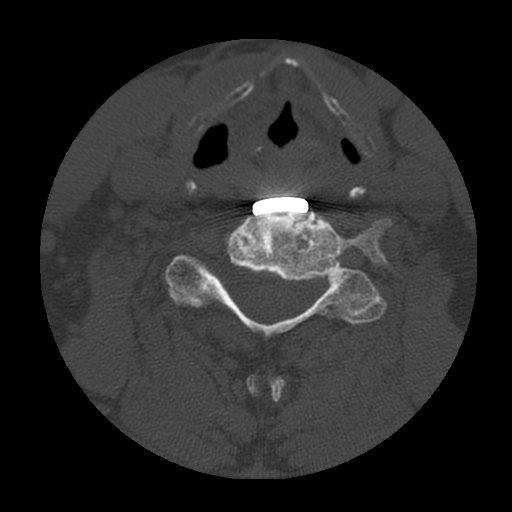
[im 82/109  bone]
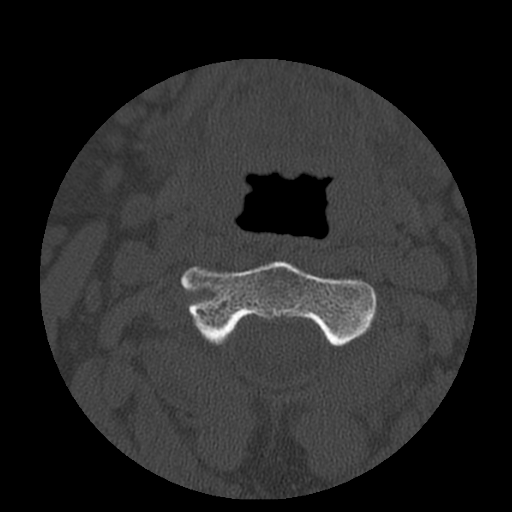

[16 of 33 positions shown; findings below may reference images not displayed]

FINDINGS: Craniocervical junction through C3-4: No acute abnormality. Probable
auto fusion of the right lateral masses of C2 and C3.

C3-4: Since the intraoperative images, the left screw in the CT
vertebral body has backed [DATE] mm. The [REDACTED] anatomic peek cage
at C3-4 has subsided into the anterior aspect of C3 and to a lesser
degree into the anterior aspect of C4. There is slight lucency
around the tip of the right screw and that screw has backed [DATE]
mm.

There is new disc space narrowing at that level since the
intraoperative exam. No impingement upon the spinal canal.

The fusion at C4-5 demonstrates no complicating findings. Solid
fusion at C5-6 and C6-7.

No other acute abnormalities.
IMPRESSION: Nonunion of the anterior fusion at C3-4 with subsidence of the peak
cage into the C3 and C4 vertebra with loosening of the screws in the
C3 vertebra.
